# Patient Record
Sex: Male | Born: 2000 | Race: White | Hispanic: No | Marital: Single
Health system: Southern US, Community
[De-identification: ages and names within clinical notes are randomized; demographics above are authoritative.]

## PROBLEM LIST (undated history)

## (undated) DIAGNOSIS — E7800 Pure hypercholesterolemia, unspecified: Secondary | ICD-10-CM

## (undated) DIAGNOSIS — R159 Full incontinence of feces: Secondary | ICD-10-CM

---
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
## 2024-08-26 ENCOUNTER — Other Ambulatory Visit: Payer: Self-pay | Admitting: Family Medicine
